# Patient Record
Sex: Female | Born: 1995 | Race: Black or African American | Hispanic: No | Marital: Single | Smoking: Never smoker
Health system: Southern US, Community
[De-identification: ages and names within clinical notes are randomized; demographics above are authoritative.]

## PROBLEM LIST (undated history)

## (undated) DIAGNOSIS — J45909 Unspecified asthma, uncomplicated: Secondary | ICD-10-CM

---

## 2018-08-20 ENCOUNTER — Encounter (HOSPITAL_COMMUNITY): Payer: Self-pay

## 2018-08-20 ENCOUNTER — Emergency Department (HOSPITAL_COMMUNITY): Payer: Self-pay

## 2018-08-20 ENCOUNTER — Other Ambulatory Visit: Payer: Self-pay

## 2018-08-20 ENCOUNTER — Emergency Department (HOSPITAL_COMMUNITY)
Admission: EM | Admit: 2018-08-20 | Discharge: 2018-08-20 | Disposition: A | Discharge status: Home / Self Care | Payer: Self-pay | Attending: Emergency Medicine | Admitting: Emergency Medicine

## 2018-08-20 DIAGNOSIS — J45901 Unspecified asthma with (acute) exacerbation: Secondary | ICD-10-CM | POA: Insufficient documentation

## 2018-08-20 HISTORY — DX: Unspecified asthma, uncomplicated: J45.909

## 2018-08-20 LAB — POC URINE PREG, ED: Preg Test, Ur: NEGATIVE

## 2018-08-20 MED ORDER — PREDNISONE 20 MG PO TABS
40.0000 mg | ORAL_TABLET | Freq: Once | ORAL | Status: AC
  Administered 2018-08-20: 40 mg via ORAL
  Filled 2018-08-20: qty 2

## 2018-08-20 MED ORDER — AEROCHAMBER PLUS FLO-VU LARGE MISC
1.0000 | Freq: Once | Status: AC
  Administered 2018-08-20: 1
  Filled 2018-08-20: qty 1

## 2018-08-20 MED ORDER — PREDNISONE 10 MG PO TABS: 40.0000 mg | ORAL_TABLET | Freq: Every day | ORAL | 0 refills | Status: AC

## 2018-08-20 MED ORDER — ALBUTEROL SULFATE HFA 108 (90 BASE) MCG/ACT IN AERS
2.0000 | INHALATION_SPRAY | Freq: Once | RESPIRATORY_TRACT | Status: AC
  Administered 2018-08-20: 2 via RESPIRATORY_TRACT
  Filled 2018-08-20: qty 6.7

## 2018-08-20 MED ORDER — FLUTICASONE-SALMETEROL 100-50 MCG/DOSE IN AEPB: 1.0000 | INHALATION_SPRAY | Freq: Two times a day (BID) | RESPIRATORY_TRACT | 0 refills | Status: AC

## 2018-08-20 NOTE — ED Provider Notes (Signed)
[facility] EMERGENCY DEPARTMENT Provider Note   CSN: [id number] Arrival date & time: 08/20/18  1031    History   Chief Complaint Chief Complaint  Patient presents with  . Asthma    HPI Teresa Sanford is a 23 y.o. female presenting today for "asthma".  Patient states that she has been experiencing asthma exacerbation for the past 4 days.  She reports that she has recently run out of her albuterol as well as her Advair.  She attempted to contact her primary care provider however due to recent move and insurance issues she is unable to obtain a refill of other these medications.  She reports difficulty breathing consistent with past asthma exacerbations over the past 4 days without aggravating or alleviating factors.  Patient reports that she has had a history of asthma since she was 23 years old she denies abnormal features of this asthma exacerbation.  Patient does report a mild nonproductive cough over the past 4 days she states that this is similar to past asthma exacerbation coughs she denies hemoptysis.  Additionally patient denies history of fever/chills, chest pain, abdominal pain, nausea/vomiting, diarrhea or any additional concerns.  Additionally patient denies any recent travel or contact with known COVID-19 individuals.  Patient denies history of DVT/PE, recent immobilization/surgery, history of trauma, hemoptysis, history of cancer, extremity pain/swelling or exogenous hormone use.    HPI  Past Medical History:  Diagnosis Date  . Asthma     There are no active problems to display for this patient.   History reviewed. No pertinent surgical history.   OB History    Gravida  0   Para  0   Term  0   Preterm  0   AB  0   Living  0     SAB  0   TAB  0   Ectopic  0   Multiple  0   Live Births  0            Home Medications    Prior to Admission medications   Medication Sig Start Date End Date Taking? Authorizing Provider   Fluticasone-Salmeterol (ADVAIR DISKUS) 100-50 MCG/DOSE AEPB Inhale 1 puff into the lungs 2 (two) times daily. 08/20/18   Harlene Salts A, PA-C  predniSONE (DELTASONE) 10 MG tablet Take 4 tablets (40 mg total) by mouth daily for 4 days. 08/21/18 08/25/18  Bill Salinas, PA-C    Family History History reviewed. No pertinent family history.  Social History Social History   Tobacco Use  . Smoking status: Never Smoker  . Smokeless tobacco: Never Used  Substance Use Topics  . Alcohol use: Yes    Comment: socially  . Drug use: Never     Allergies   Patient has no known allergies.   Review of Systems Review of Systems  Constitutional: Negative.  Negative for chills, diaphoresis and fever.  HENT: Negative.  Negative for congestion, rhinorrhea, sore throat, trouble swallowing and voice change.   Respiratory: Positive for chest tightness and shortness of breath. Negative for cough.   Cardiovascular: Negative.  Negative for chest pain and leg swelling.  Gastrointestinal: Negative.  Negative for abdominal pain, diarrhea, nausea and vomiting.  Musculoskeletal: Negative.  Negative for arthralgias and myalgias.  Neurological: Negative.  Negative for weakness and headaches.  All other systems reviewed and are negative.  Physical Exam Updated Vital Signs BP 126/82 (BP Location: Right Arm)   Pulse 84   Temp 98.4 F (36.9 C) (Oral)  Resp 16   Ht 4\' 11"  (1.499 m)   Wt 66.7 kg   LMP 08/12/2018   SpO2 100%   BMI 29.69 kg/m   Physical Exam Constitutional:      General: She is not in acute distress.    Appearance: Normal appearance. She is well-developed. She is not ill-appearing or diaphoretic.  HENT:     Head: Normocephalic and atraumatic.     Right Ear: External ear normal.     Left Ear: External ear normal.     Nose: Nose normal.     Mouth/Throat:     Mouth: Mucous membranes are moist.     Pharynx: Oropharynx is clear.  Eyes:     General: Vision grossly intact. Gaze  aligned appropriately.     Conjunctiva/sclera: Conjunctivae normal.     Pupils: Pupils are equal, round, and reactive to light.  Neck:     Musculoskeletal: Normal range of motion.     Trachea: Trachea and phonation normal. No tracheal deviation.  Cardiovascular:     Rate and Rhythm: Normal rate and regular rhythm.     Pulses: Normal pulses.     Heart sounds: Normal heart sounds.  Pulmonary:     Effort: Pulmonary effort is normal. No tachypnea, accessory muscle usage or respiratory distress.     Breath sounds: Normal air entry. Wheezing present. No rhonchi.     Comments: Mild bilateral expiratory wheezing present. Chest:     Chest wall: No tenderness.  Abdominal:     General: There is no distension.     Palpations: Abdomen is soft.     Tenderness: There is no abdominal tenderness. There is no guarding or rebound.  Musculoskeletal: Normal range of motion.        General: No tenderness.     Right lower leg: No edema.     Left lower leg: No edema.  Skin:    General: Skin is warm and dry.  Neurological:     General: No focal deficit present.     Mental Status: She is alert.     GCS: GCS eye subscore is 4. GCS verbal subscore is 5. GCS motor subscore is 6.     Comments: Speech is clear and goal oriented, follows commands Major Cranial nerves without deficit, no facial droop Moves extremities without ataxia, coordination intact Normal gait  Psychiatric:        Mood and Affect: Mood normal.        Behavior: Behavior normal.    ED Treatments / Results  Labs (all labs ordered are listed, but only abnormal results are displayed) Labs Reviewed  POC URINE PREG, ED   EKG None  Radiology Dg Chest Portable 1 View  Result Date: 08/20/2018 CLINICAL DATA:  Pt reports having worsening asthma symptoms for the last 4 days. Pt reports using albuterol inhaler and zyrtec. Pt reports hx of using advair and now her insurance doesn't cover it and needs an alternative. Pt reports just moving  here so she doesn't have a PCP yet. No distress upon assessment. EXAM: PORTABLE CHEST 1 VIEW COMPARISON:  None. FINDINGS: Normal heart, mediastinum and hila. Clear lungs.  No pleural effusion or pneumothorax. Skeletal structures are intact. IMPRESSION: No active disease. Electronically Signed   By: Amie Portland M.D.   On: 08/20/2018 11:23    Procedures Procedures (including critical care time)  Medications Ordered in ED Medications  albuterol (PROVENTIL HFA;VENTOLIN HFA) 108 (90 Base) MCG/ACT inhaler 2 puff (2 puffs Inhalation Given 08/20/18  1127)  AeroChamber Plus Flo-Vu Large MISC 1 each (1 each Other Given 08/20/18 1128)  predniSONE (DELTASONE) tablet 40 mg (40 mg Oral Given 08/20/18 1207)     Initial Impression / Assessment and Plan / ED Course  I have reviewed the triage vital signs and the nursing notes.  Pertinent labs & imaging results that were available during my care of the patient were reviewed by me and considered in my medical decision making (see chart for details).    23 year old female presented today for asthma exacerbation.  She has run out of her home medications 4 days ago noted to have bilateral expiratory wheezing on arrival.  Suspect patient with asthma exacerbation today based on history and physical examination today.  I have low suspicion for ACS, PE, dissection or other acute cardiopulmonary processes at this time.  Patient was given albuterol inhaler and spacer today, patient took two doses of albuterol inhaler while here in the emergency department with complete resolution of her symptoms.  On reexamination lungs are clear to auscultation bilaterally and her wheezing has resolved.  Vital signs within normal limits and no hypoxia on room air.  Patient reports that she is breathing at baseline and there is no accessory muscle usage or tachypnea.  Patient is resting comfortably in no acute distress.  She is requesting be discharged at this time.  Additionally she has been  started on prednisone 40 mg x 5 days for her asthma exacerbation.  She is also been given an Advair refill today.  Referral given to establish PCP.  Patient with no further concerns or complaints.  This patient was evaluated in the context of the global COVID-19 pandemic, which necessitated consideration that the patient might be at risk for infection with the SARS-CoV-2 virus that causes COVID-19. Institutional protocols and algorithms that pertain to the evaluation of patients at risk for COVID-19 are in a state of rapid change based on information released by regulatory bodies including the CDC and federal and state organizations. These policies and algorithms were followed during the patient's care in the ED. as patient is with her typical asthma symptoms without abnormal features, no history of fever or productive cough, no sick contacts, recent travel or known COVID-19 patient's she does not meet criteria under current guidelines for testing of 19.  At this time there does not appear to be any evidence of an acute emergency medical condition and the patient appears stable for discharge with appropriate outpatient follow up. Diagnosis was discussed with patient who verbalizes understanding of care plan and is agreeable to discharge. I have discussed return precautions with patient who verbalizes understanding of return precautions. Patient encouraged to follow-up with their PCP. All questions answered.   Note: Portions of this report may have been transcribed using voice recognition software. Every effort was made to ensure accuracy; however, inadvertent computerized transcription errors may still be present. Final Clinical Impressions(s) / ED Diagnoses   Final diagnoses:  Exacerbation of asthma, unspecified asthma severity, unspecified whether persistent    ED Discharge Orders         Ordered    predniSONE (DELTASONE) 10 MG tablet  Daily     08/20/18 1213    Fluticasone-Salmeterol (ADVAIR  DISKUS) 100-50 MCG/DOSE AEPB  2 times daily     08/20/18 [address]1213           Tailer Volkert A, PA-C 08/20/18 1229    Shaune PollackIsaacs, [place], MD 08/20/18 1238

## 2018-08-20 NOTE — ED Triage Notes (Signed)
Pt reports having worsening asthma symptoms for the last 4 days.  Pt reports using albuterol inhaler and zyrtec.  Pt reports hx of using advair and now her insurance doesn't cover it and needs an alternative.  Pt reports just moving here so she doesn't have a PCP yet.  No distress upon assessment.

## 2018-08-20 NOTE — Discharge Instructions (Signed)
You have been diagnosed today with asthma exacerbation.  At this time there does not appear to be the presence of an emergent medical condition, however there is always the potential for conditions to change. Please read and follow the below instructions.  Please return to the Emergency Department immediately for any new or worsening symptoms. Please be sure to follow up with your Primary Care Provider within one week regarding your visit today; please call their office to schedule an appointment even if you are feeling better for a follow-up visit. You may use the inhaler/spacer given you today to help with your asthma symptoms.  If you feel that this medication is not working or that you are using it too often please return to the emergency department for further evaluation. Please continue to use the prednisone 40 mg daily as prescribed for the next 4 days, you have been given your first dose today, you may take your second dose tomorrow. You have been given a refill of your home Advair discus use this as previously prescribed. You may establish primary care provider at [facility] please call their office to schedule an appointment.  Get help right away if: You seem to be worse and are not responding to medicine during an asthma attack. You are short of breath even at rest. You get short of breath when doing very little activity. You have trouble eating, drinking, or talking. You have chest pain or tightness. You have a fast heartbeat. Your lips or fingernails start to turn blue. You are light-headed or dizzy, or you faint. Your peak flow is less than 50% of your personal best. You feel too tired to breathe normally. You have any new/concerning or worsening of symptoms  Please read the additional information packets attached to your discharge summary.  Do not take your medicine if  develop an itchy rash, swelling in your mouth or lips, or difficulty  breathing.

## 2020-09-25 IMAGING — DX PORTABLE CHEST - 1 VIEW
1 series · 1 of 1 positions shown · non-contrast
Comparison: None.

CLINICAL DATA: Pt reports having worsening asthma symptoms for the
last 4 days. Pt reports using albuterol inhaler and zyrtec. Pt
reports hx of using advair and now her insurance doesn't cover it
and needs an alternative. Pt reports just moving here so she doesn't
have a PCP yet. No distress upon assessment.

EXAM:
PORTABLE CHEST 1 VIEW

[chest ap]
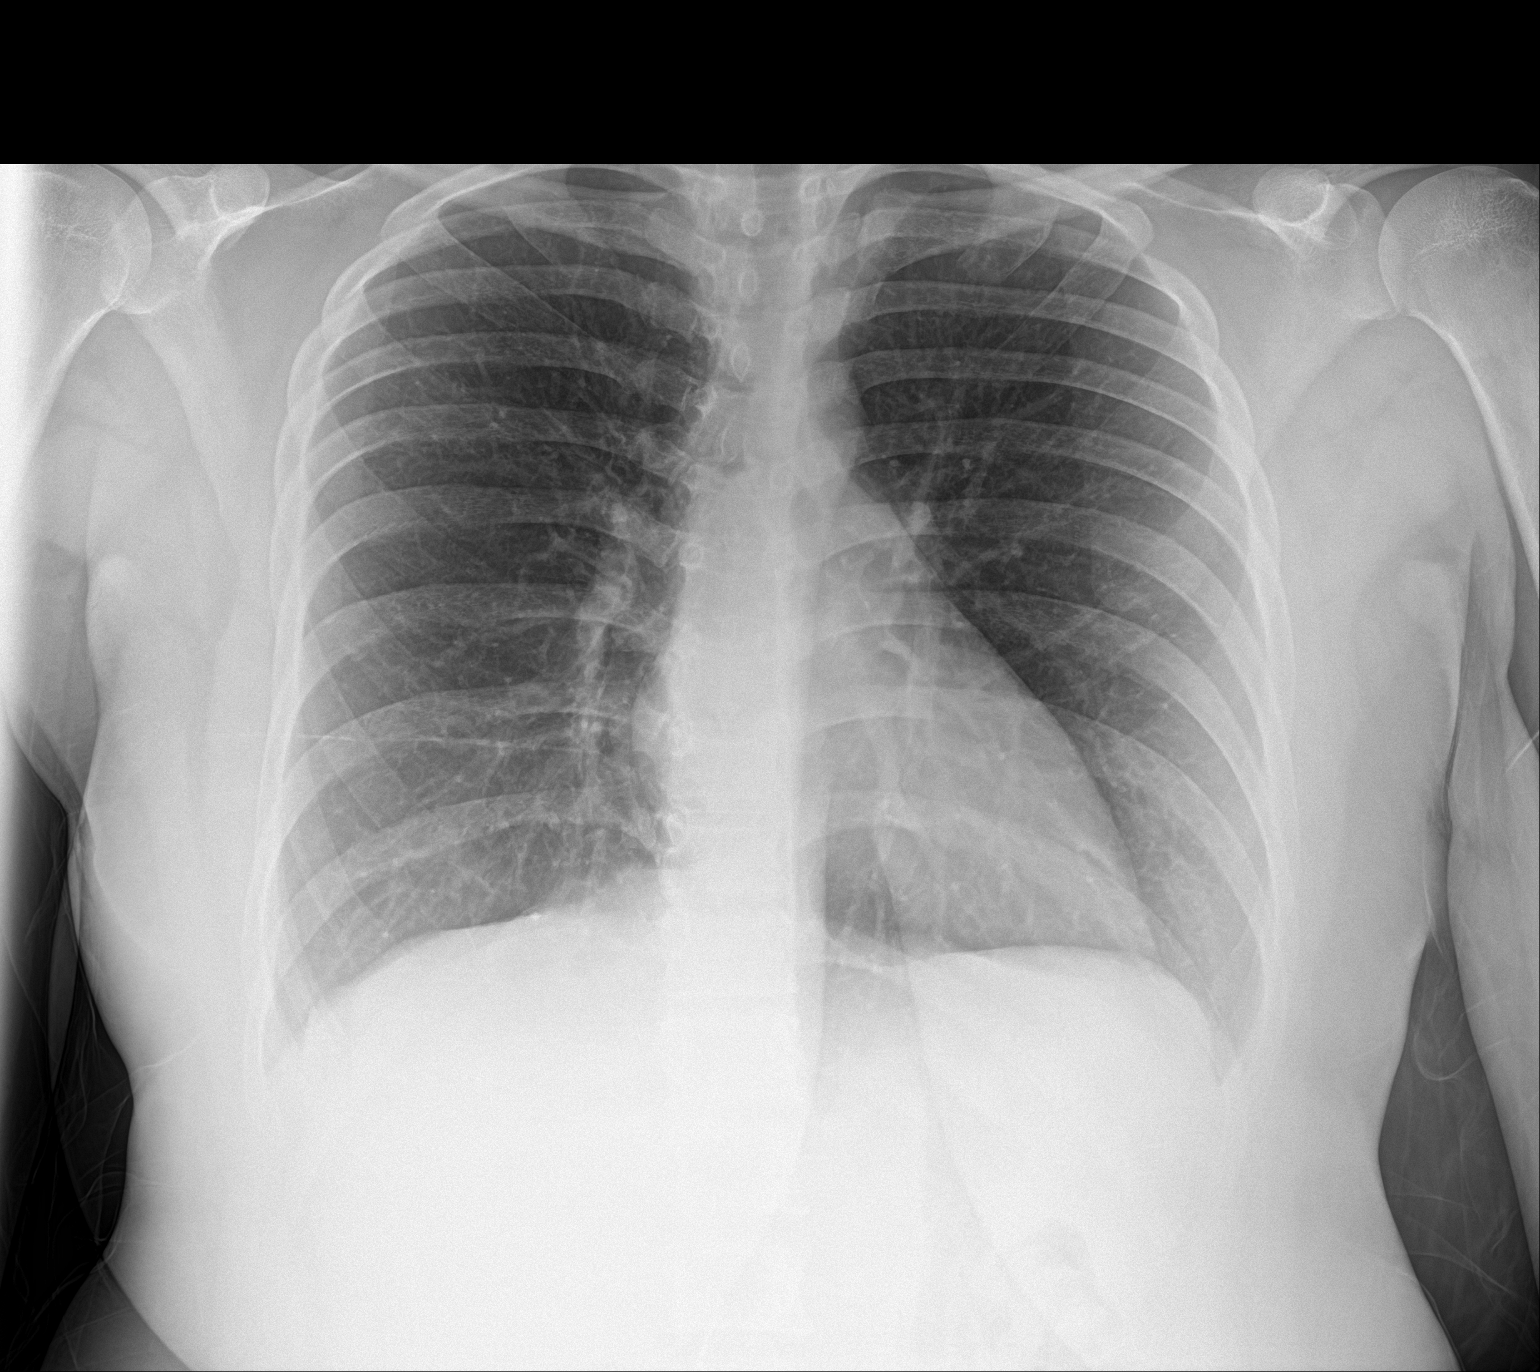

[1 of 1 positions shown; findings below may reference images not displayed]

FINDINGS: Normal heart, mediastinum and hila.

Clear lungs.  No pleural effusion or pneumothorax.

Skeletal structures are intact.
IMPRESSION: No active disease.
